# Patient Record
Sex: Male | Born: 1980 | Race: Black or African American | Hispanic: No | Marital: Single | Smoking: Never smoker
Health system: Southern US, Community
[De-identification: ages and names within clinical notes are randomized; demographics above are authoritative.]

## PROBLEM LIST (undated history)

## (undated) DIAGNOSIS — B029 Zoster without complications: Secondary | ICD-10-CM

## (undated) HISTORY — PX: HERNIA REPAIR: SHX51

## (undated) HISTORY — PX: ACHILLES TENDON REPAIR: SUR1153

---

## 2001-05-01 ENCOUNTER — Emergency Department (HOSPITAL_COMMUNITY): Admission: EM | Admit: 2001-05-01 | Discharge: 2001-05-01 | Payer: Self-pay | Admitting: Emergency Medicine

## 2003-07-21 ENCOUNTER — Emergency Department (HOSPITAL_COMMUNITY): Admission: EM | Admit: 2003-07-21 | Discharge: 2003-07-21 | Payer: Self-pay | Admitting: Family Medicine

## 2005-08-13 ENCOUNTER — Emergency Department (HOSPITAL_COMMUNITY): Admission: EM | Admit: 2005-08-13 | Discharge: 2005-08-13 | Payer: Self-pay | Admitting: Emergency Medicine

## 2007-07-19 ENCOUNTER — Emergency Department (HOSPITAL_COMMUNITY): Admission: EM | Admit: 2007-07-19 | Discharge: 2007-07-20 | Payer: Self-pay | Admitting: Emergency Medicine

## 2007-07-24 ENCOUNTER — Emergency Department (HOSPITAL_COMMUNITY): Admission: EM | Admit: 2007-07-24 | Discharge: 2007-07-24 | Payer: Self-pay | Admitting: Family Medicine

## 2008-10-20 ENCOUNTER — Emergency Department (HOSPITAL_BASED_OUTPATIENT_CLINIC_OR_DEPARTMENT_OTHER): Admission: EM | Admit: 2008-10-20 | Discharge: 2008-10-21 | Payer: Self-pay | Admitting: Emergency Medicine

## 2008-10-20 DIAGNOSIS — J020 Streptococcal pharyngitis: Secondary | ICD-10-CM

## 2008-11-03 ENCOUNTER — Ambulatory Visit: Payer: Self-pay | Admitting: Internal Medicine

## 2008-12-29 ENCOUNTER — Ambulatory Visit: Payer: Self-pay | Admitting: Internal Medicine

## 2008-12-29 DIAGNOSIS — J029 Acute pharyngitis, unspecified: Secondary | ICD-10-CM

## 2008-12-29 DIAGNOSIS — K1370 Unspecified lesions of oral mucosa: Secondary | ICD-10-CM

## 2009-02-03 ENCOUNTER — Ambulatory Visit: Payer: Self-pay | Admitting: Internal Medicine

## 2009-02-03 DIAGNOSIS — R300 Dysuria: Secondary | ICD-10-CM

## 2009-02-03 LAB — CONVERTED CEMR LAB: GC Probe Amp, Urine: NEGATIVE

## 2009-02-04 ENCOUNTER — Ambulatory Visit: Payer: Self-pay | Admitting: Internal Medicine

## 2009-02-04 ENCOUNTER — Telehealth: Payer: Self-pay | Admitting: Internal Medicine

## 2010-07-04 LAB — RAPID STREP SCREEN (MED CTR MEBANE ONLY): Streptococcus, Group A Screen (Direct): POSITIVE — AB

## 2010-12-21 LAB — POCT I-STAT, CHEM 8
BUN: 10
Chloride: 104
Potassium: 3.8
Sodium: 140

## 2010-12-21 LAB — DIFFERENTIAL
Basophils Absolute: 0
Lymphocytes Relative: 17
Monocytes Absolute: 0.7
Neutro Abs: 5.2

## 2010-12-21 LAB — CBC
Hemoglobin: 14.4
RDW: 13.2

## 2012-12-15 ENCOUNTER — Emergency Department (HOSPITAL_BASED_OUTPATIENT_CLINIC_OR_DEPARTMENT_OTHER): Payer: Managed Care, Other (non HMO)

## 2012-12-15 ENCOUNTER — Encounter (HOSPITAL_BASED_OUTPATIENT_CLINIC_OR_DEPARTMENT_OTHER): Payer: Self-pay | Admitting: Emergency Medicine

## 2012-12-15 ENCOUNTER — Emergency Department (HOSPITAL_BASED_OUTPATIENT_CLINIC_OR_DEPARTMENT_OTHER)
Admission: EM | Admit: 2012-12-15 | Discharge: 2012-12-16 | Disposition: A | Discharge status: Home / Self Care | Payer: Managed Care, Other (non HMO) | Attending: Emergency Medicine | Admitting: Emergency Medicine

## 2012-12-15 DIAGNOSIS — M62838 Other muscle spasm: Secondary | ICD-10-CM | POA: Insufficient documentation

## 2012-12-15 MED ORDER — IBUPROFEN 800 MG PO TABS
800.0000 mg | ORAL_TABLET | Freq: Once | ORAL | Status: AC
  Administered 2012-12-15: 800 mg via ORAL
  Filled 2012-12-15: qty 1

## 2012-12-15 MED ORDER — METHOCARBAMOL 500 MG PO TABS
1000.0000 mg | ORAL_TABLET | Freq: Once | ORAL | Status: AC
  Administered 2012-12-15: 1000 mg via ORAL
  Filled 2012-12-15: qty 2

## 2012-12-15 NOTE — ED Provider Notes (Signed)
CSN: [id number]     Arrival date & time 12/15/12  2227 History  This chart was scribed for Sharley Keeler Smitty Cords, MD by Ronal Fear, ED Scribe. This patient was seen in room MH05/MH05 and the patient's care was started at 11:29 PM.      Chief Complaint  Patient presents with  . Muscle Pain    left chest after heavy lifting    Patient is a 32 y.o. male presenting with musculoskeletal pain. The history is provided by the patient. No language interpreter was used.  Muscle Pain This is a new problem. The current episode started 6 to 12 hours ago. The problem occurs rarely. The problem has not changed since onset.Pertinent negatives include no chest pain, no abdominal pain, no headaches and no shortness of breath. Exacerbated by: movement in certain directions. Nothing relieves the symptoms. He has tried nothing for the symptoms. The treatment provided no relief.    HPI Comments: Fernando Perez is a 32 y.o. male who presents to the Emergency Department complaining of muscle pain which is possibly due to lifting earlier this afternon.  Pt noticed irregularity while at the movies and he felt the ache in his left pectoral.  Later in the evening he was at a family members house holding his niece and the muscle spasm reoccurred.    History reviewed. No pertinent past medical history. Past Surgical History  Procedure Laterality Date  . Achilles tendon repair Right    History reviewed. No pertinent family history. History  Substance Use Topics  . Smoking status: Never Smoker   . Smokeless tobacco: Not on file  . Alcohol Use: Yes    Review of Systems  Respiratory: Negative for shortness of breath.   Cardiovascular: Negative for chest pain.  Gastrointestinal: Negative for abdominal pain.  Musculoskeletal:       Muscle spasms  Neurological: Negative for headaches.  All other systems reviewed and are negative.    Allergies  Doxycycline hyclate  Home Medications  No current outpatient  prescriptions on file. BP 134/94  Pulse 68  Temp(Src) 98 F (36.7 C) (Oral)  Resp 18  SpO2 100% Physical Exam  Nursing note and vitals reviewed. Constitutional: He is oriented to person, place, and time. He appears well-developed and well-nourished. No distress.  HENT:  Head: Normocephalic and atraumatic.  Mouth/Throat: Oropharynx is clear and moist.  Eyes: EOM are normal. Pupils are equal, round, and reactive to light.  Neck: Normal range of motion. Neck supple. No tracheal deviation present.  Cardiovascular: Normal rate, regular rhythm, normal heart sounds and intact distal pulses.   Pulmonary/Chest: Effort normal. No respiratory distress. He has no wheezes. He has no rales.  Abdominal: Soft. Bowel sounds are normal. There is no tenderness. There is no rebound and no guarding.  Musculoskeletal: Normal range of motion.  Symmetric bulk in pectoralis, no crepitus over sternum, trachea midline. Both nipples out, no retraction muscle spasm in left pectoral. No winging of scapula, symmetric bulk, spin is midline  Lymphadenopathy:    He has no cervical adenopathy.  Neurological: He is alert and oriented to person, place, and time. He has normal reflexes. He displays normal reflexes. He exhibits normal muscle tone.  Skin: Skin is warm and dry.  Psychiatric: He has a normal mood and affect. His behavior is normal.    ED Course  Procedures (including critical care time)  DIAGNOSTIC STUDIES: Oxygen Saturation is 100% on RA, normal by my interpretation.    COORDINATION OF CARE: 11:35 PM-  Pt advised of plan for treatment including chest X-ray, high dose antiinflammatory medication no lifting and a muscle relaxer and applying heat to pectoral.  Pt is to drink only water and pt agrees.      Labs Review Labs Reviewed - No data to display Imaging Review No results found.  MDM  No diagnosis found. Pectoralis spasm  No lifting x 7 days pain medication and muscle relaxant I personally  performed the services described in this documentation, which was scribed in my presence. The recorded information has been reviewed and is accurate.    Jasmine Awe, MD 12/16/12 [phone]

## 2012-12-15 NOTE — ED Notes (Signed)
Pt reports while lifting weight during work out believes he injury pectoris muscle. He notice muscle deformity and pain while lifting child after work out

## 2012-12-16 MED ORDER — METHOCARBAMOL 500 MG PO TABS: 500.0000 mg | ORAL_TABLET | Freq: Two times a day (BID) | ORAL | Status: AC

## 2012-12-16 MED ORDER — IBUPROFEN 800 MG PO TABS: 800.0000 mg | ORAL_TABLET | Freq: Three times a day (TID) | ORAL | Status: AC

## 2013-09-11 ENCOUNTER — Emergency Department (HOSPITAL_BASED_OUTPATIENT_CLINIC_OR_DEPARTMENT_OTHER)
Admission: EM | Admit: 2013-09-11 | Discharge: 2013-09-11 | Disposition: A | Discharge status: Home / Self Care | Payer: Managed Care, Other (non HMO) | Attending: Emergency Medicine | Admitting: Emergency Medicine

## 2013-09-11 ENCOUNTER — Encounter (HOSPITAL_BASED_OUTPATIENT_CLINIC_OR_DEPARTMENT_OTHER): Payer: Self-pay | Admitting: Emergency Medicine

## 2013-09-11 DIAGNOSIS — B029 Zoster without complications: Secondary | ICD-10-CM | POA: Insufficient documentation

## 2013-09-11 DIAGNOSIS — Z791 Long term (current) use of non-steroidal anti-inflammatories (NSAID): Secondary | ICD-10-CM | POA: Insufficient documentation

## 2013-09-11 DIAGNOSIS — Z79899 Other long term (current) drug therapy: Secondary | ICD-10-CM | POA: Insufficient documentation

## 2013-09-11 LAB — HIV ANTIBODY (ROUTINE TESTING W REFLEX): HIV 1&2 Ab, 4th Generation: NONREACTIVE

## 2013-09-11 MED ORDER — HYDROXYZINE HCL 25 MG PO TABS
25.0000 mg | ORAL_TABLET | ORAL | Status: AC | PRN
Start: 2013-09-11 — End: ?

## 2013-09-11 MED ORDER — ACYCLOVIR 800 MG PO TABS: 800.0000 mg | ORAL_TABLET | Freq: Every day | ORAL | Status: AC

## 2013-09-11 MED ORDER — PREDNISONE 20 MG PO TABS: ORAL_TABLET | ORAL | Status: AC

## 2013-09-11 NOTE — Discharge Instructions (Signed)
Shingles °Shingles (herpes zoster) is an infection that is caused by the same virus that causes chickenpox (varicella). The infection causes a painful skin rash and fluid-filled blisters, which eventually break open, crust over, and heal. It may occur in any area of the body, but it usually affects only one side of the body or face. The pain of shingles usually lasts about 1 month. However, some people with shingles may develop long-term (chronic) pain in the affected area of the body. °Shingles often occurs many years after the person had chickenpox. It is more common: °· In people older than 50 years. °· In people with weakened immune systems, such as those with HIV, AIDS, or cancer. °· In people taking medicines that weaken the immune system, such as transplant medicines. °· In people under great stress. °CAUSES  °Shingles is caused by the varicella zoster virus (VZV), which also causes chickenpox. After a person is infected with the virus, it can remain in the person's body for years in an inactive state (dormant). To cause shingles, the virus reactivates and breaks out as an infection in a nerve root. °The virus can be spread from person to person (contagious) through contact with open blisters of the shingles rash. It will only spread to people who have not had chickenpox. When these people are exposed to the virus, they may develop chickenpox. They will not develop shingles. Once the blisters scab over, the person is no longer contagious and cannot spread the virus to others. °SYMPTOMS  °Shingles shows up in stages. The initial symptoms may be pain, itching, and tingling in an area of the skin. This pain is usually described as burning, stabbing, or throbbing. In a few days or weeks, a painful red rash will appear in the area where the pain, itching, and tingling were felt. The rash is usually on one side of the body in a band or belt-like pattern. Then, the rash usually turns into fluid-filled blisters. They  will scab over and dry up in approximately 2-3 weeks. °Flu-like symptoms may also occur with the initial symptoms, the rash, or the blisters. These may include: °· Fever. °· Chills. °· Headache. °· Upset stomach. °DIAGNOSIS  °Your caregiver will perform a skin exam to diagnose shingles. Skin scrapings or fluid samples may also be taken from the blisters. This sample will be examined under a microscope or sent to a lab for further testing. °TREATMENT  °There is no specific cure for shingles. Your caregiver will likely prescribe medicines to help you manage the pain, recover faster, and avoid long-term problems. This may include antiviral drugs, anti-inflammatory drugs, and pain medicines. °HOME CARE INSTRUCTIONS  °· Take a cool bath or apply cool compresses to the area of the rash or blisters as directed. This may help with the pain and itching.   °· Only take over-the-counter or prescription medicines as directed by your caregiver.   °· Rest as directed by your caregiver. °· Keep your rash and blisters clean with mild soap and cool water or as directed by your caregiver.  °· Do not pick your blisters or scratch your rash. Apply an anti-itch cream or numbing creams to the affected area as directed by your caregiver. °· Keep your shingles rash covered with a loose bandage (dressing). °· Avoid skin contact with: °¨ Babies.   °¨ Pregnant women.   °¨ Children with eczema.   °¨ Elderly people with transplants.   °¨ People with chronic illnesses, such as leukemia or AIDS.   °· Wear loose-fitting clothing to help ease the   pain of material rubbing against the rash. °· Keep all follow-up appointments with your caregiver. If the area involved is on your face, you may receive a referral for follow-up to a specialist, such as an eye doctor (ophthalmologist) or an ear, nose, and throat (ENT) doctor. Keeping all follow-up appointments will help you avoid eye complications, chronic pain, or disability.   °SEEK IMMEDIATE MEDICAL  CARE IF:  °· You have facial pain, pain around the eye area, or loss of feeling on one side of your face. °· You have ear pain or ringing in your ear. °· You have loss of taste. °· Your pain is not relieved with prescribed medicines.   °· Your redness or swelling spreads.   °· You have more pain and swelling.  °· Your condition is worsening or has changed.   °· You have a fever or persistent symptoms for more than 2-3 days. °· You have a fever and your symptoms suddenly get worse. °MAKE SURE YOU: °· Understand these instructions. °· Will watch your condition. °· Will get help right away if you are not doing well or get worse. °Document Released: 03/14/2005 Document Revised: 12/07/2011 Document Reviewed: 10/27/2011 °[employer]® Patient Information ©2015 [employer]. This information is not intended to replace advice given to you by your health care provider. Make sure you discuss any questions you have with your health care provider. ° °

## 2013-09-11 NOTE — ED Provider Notes (Addendum)
CSN: [id number]     Arrival date & time 09/11/13  1334 History   First MD Initiated Contact with Patient 09/11/13 1339     Chief Complaint  Patient presents with  . Rash     (Consider location/radiation/quality/duration/timing/severity/associated sxs/prior Treatment) HPI Comments: Patient presents to the ER for evaluation of a rash on his chest and back. Symptoms first came out 2 days ago. He noticed some itching on his back but did not think much of it. The next day he noticed itching on his chest when he looked in the mirror there was a rash there. This rash has spread across the entire length of the right side. He has noticed a small bump on his right shoulder as well now. It is very itchy but nontender.  Patient is a 33 y.o. male presenting with rash.  Rash   History reviewed. No pertinent past medical history. Past Surgical History  Procedure Laterality Date  . Achilles tendon repair Right    No family history on file. History  Substance Use Topics  . Smoking status: Never Smoker   . Smokeless tobacco: Not on file  . Alcohol Use: Yes    Review of Systems  Skin: Positive for rash.  All other systems reviewed and are negative.     Allergies  Doxycycline hyclate  Home Medications   Prior to Admission medications   Medication Sig Start Date End Date Taking? Authorizing Provider  ibuprofen (ADVIL,MOTRIN) 800 MG tablet Take 1 tablet (800 mg total) by mouth 3 (three) times daily. 12/16/12   April K Palumbo-Rasch, MD  methocarbamol (ROBAXIN) 500 MG tablet Take 1 tablet (500 mg total) by mouth 2 (two) times daily. 12/16/12   April K Palumbo-Rasch, MD   BP 159/97  Pulse 67  Temp(Src) 98.4 F (36.9 C) (Oral)  Resp 18  Ht 5\' 9"  (1.753 m)  Wt 148 lb (67.132 kg)  BMI 21.85 kg/m2  SpO2 100% Physical Exam  Constitutional: He is oriented to person, place, and time. He appears well-developed and well-nourished. No distress.  HENT:  Head: Normocephalic and atraumatic.    Right Ear: Hearing normal.  Left Ear: Hearing normal.  Nose: Nose normal.  Mouth/Throat: Oropharynx is clear and moist and mucous membranes are normal.  Eyes: Conjunctivae and EOM are normal. Pupils are equal, round, and reactive to light.  Neck: Normal range of motion. Neck supple.  Cardiovascular: Regular rhythm, S1 normal and S2 normal.  Exam reveals no gallop and no friction rub.   No murmur heard. Pulmonary/Chest: Effort normal and breath sounds normal. No respiratory distress. He exhibits no tenderness.  Abdominal: Soft. Normal appearance and bowel sounds are normal. There is no hepatosplenomegaly. There is no tenderness. There is no rebound, no guarding, no tenderness at McBurney's point and negative Murphy's sign. No hernia.  Musculoskeletal: Normal range of motion.  Neurological: He is alert and oriented to person, place, and time. He has normal strength. No cranial nerve deficit or sensory deficit. Coordination normal. GCS eye subscore is 4. GCS verbal subscore is 5. GCS motor subscore is 6.  Skin: Skin is warm, dry and intact. Rash (Vesicular, clustered rash linearly oriented from thoracic area around her right flank to the sternum region following a dermatomal distribution) noted. No cyanosis.     Psychiatric: He has a normal mood and affect. His speech is normal and behavior is normal. Thought content normal.    ED Course  Procedures (including critical care time) Labs Review Labs Reviewed - No  data to display  Imaging Review No results found.   EKG Interpretation None      MDM   Final diagnoses:  None   shingles versus cutaneous herpes versus contact dermatitis  Patient presents with itchy rash on his right back, flank and chest area. Morphology of the rash is classic for shingles, although the patient is putting out pruritic symptoms, no pain. Additionally, he has 2 small vesicles on his deltoid region which would do not follow strict dermatomal distribution.  Differential diagnosis would be contact dermatitis, herpes skin lesion as well as shingles. Nephrology was not suggest bacterial infection such as cellulitis or abscess. Patient will be treated with acyclovir. In addition, we will add at his own and hydroxyzine her symptomatically. As I do still think this is shingles, we'll also prescribe Vicodin in the event that he started having pain related to the rash.  I did discuss possible implications of shingles in his age group. Specifically I discussed the possibility of being immunocompromised for his concern for HIV. He does report being tested approximately a year ago and was negative, but would like to be tested again. Blood was drawn prior to discharge.  Gilda Creasehristopher J. Pollina, MD 09/11/13 1355  Gilda Creasehristopher J. Pollina, MD 09/11/13 1409

## 2013-09-11 NOTE — ED Notes (Signed)
Pt has a rash on his back, chest, and right arm since Monday.

## 2013-09-21 ENCOUNTER — Emergency Department (HOSPITAL_BASED_OUTPATIENT_CLINIC_OR_DEPARTMENT_OTHER)
Admission: EM | Admit: 2013-09-21 | Discharge: 2013-09-22 | Disposition: A | Discharge status: Home / Self Care | Payer: Managed Care, Other (non HMO) | Attending: Emergency Medicine | Admitting: Emergency Medicine

## 2013-09-21 ENCOUNTER — Encounter (HOSPITAL_BASED_OUTPATIENT_CLINIC_OR_DEPARTMENT_OTHER): Payer: Self-pay | Admitting: Emergency Medicine

## 2013-09-21 DIAGNOSIS — G4489 Other headache syndrome: Secondary | ICD-10-CM | POA: Insufficient documentation

## 2013-09-21 DIAGNOSIS — Z79899 Other long term (current) drug therapy: Secondary | ICD-10-CM | POA: Insufficient documentation

## 2013-09-21 DIAGNOSIS — J3489 Other specified disorders of nose and nasal sinuses: Secondary | ICD-10-CM | POA: Insufficient documentation

## 2013-09-21 DIAGNOSIS — IMO0002 Reserved for concepts with insufficient information to code with codable children: Secondary | ICD-10-CM | POA: Insufficient documentation

## 2013-09-21 DIAGNOSIS — Z8619 Personal history of other infectious and parasitic diseases: Secondary | ICD-10-CM | POA: Insufficient documentation

## 2013-09-21 DIAGNOSIS — J309 Allergic rhinitis, unspecified: Secondary | ICD-10-CM | POA: Insufficient documentation

## 2013-09-21 HISTORY — DX: Zoster without complications: B02.9

## 2013-09-21 MED ORDER — IBUPROFEN 800 MG PO TABS
800.0000 mg | ORAL_TABLET | Freq: Once | ORAL | Status: AC
  Administered 2013-09-21: 800 mg via ORAL
  Filled 2013-09-21: qty 1

## 2013-09-21 MED ORDER — ACETAMINOPHEN 500 MG PO TABS
1000.0000 mg | ORAL_TABLET | Freq: Once | ORAL | Status: AC
  Administered 2013-09-21: 1000 mg via ORAL
  Filled 2013-09-21: qty 2

## 2013-09-21 NOTE — ED Notes (Signed)
Pt c/o headache x 2 days, describes as pressure; starts posteriorly and radiates to frontal. Pt sts he is taking prednisone and acyclovir for recent dx of shingles (rash has cleared).

## 2013-09-21 NOTE — ED Provider Notes (Signed)
CSN: [id number]     Arrival date & time 09/21/13  2027 History   First MD Initiated Contact with Patient 09/21/13 2300    This chart was scribed for April Smitty CordsK Palumbo-Rasch, MD by Marica OtterNusrat Rahman, ED Scribe. This patient was seen in room MH05/MH05 and the patient's care was started at 11:11 PM.  Chief Complaint  Patient presents with  . Headache   Patient is a 33 y.o. male presenting with headaches. The history is provided by the patient. No language interpreter was used.  Headache Pain location:  Occipital Quality:  Dull Radiates to:  Does not radiate Severity currently:  6/10 Severity at highest:  6/10 Onset quality:  Gradual Timing:  Constant Progression:  Unchanged Chronicity:  New Context: not activity   Relieved by:  Nothing Worsened by:  Nothing tried Ineffective treatments:  None tried Associated symptoms: congestion   Associated symptoms: no cough, no dizziness, no fever, no neck pain, no neck stiffness, no numbness, no photophobia, no sore throat and no visual change    HPI Comments: Fernando Perez is a 33 y.o. male who presents to the Emergency Department complaining of intermittent HA onset yesterday. Pt denies sore throat, itchy/watery eyes, photophobia. Pt reports he is presently taking prednisone and acyclovir for recent Dx of shingles and is near end of his taper. Pt denies taking any OTC meds at home to alleviate his pain.   Past Medical History  Diagnosis Date  . Shingles    Past Surgical History  Procedure Laterality Date  . Achilles tendon repair Right    No family history on file. History  Substance Use Topics  . Smoking status: Never Smoker   . Smokeless tobacco: Not on file  . Alcohol Use: Yes    Review of Systems  Constitutional: Negative for fever and chills.  HENT: Positive for congestion. Negative for sore throat.   Eyes: Negative for photophobia, discharge, itching and visual disturbance.  Respiratory: Negative for cough.   Musculoskeletal:  Negative for neck pain and neck stiffness.  Neurological: Positive for headaches. Negative for dizziness, weakness and numbness.  All other systems reviewed and are negative.     Allergies  Doxycycline hyclate  Home Medications   Prior to Admission medications   Medication Sig Start Date End Date Taking? Authorizing Provider  acyclovir (ZOVIRAX) 800 MG tablet Take 1 tablet (800 mg total) by mouth 5 (five) times daily. 09/11/13   Gilda Creasehristopher J. Pollina, MD  hydrOXYzine (ATARAX/VISTARIL) 25 MG tablet Take 1 tablet (25 mg total) by mouth every 4 (four) hours as needed. 09/11/13   Gilda Creasehristopher J. Pollina, MD  ibuprofen (ADVIL,MOTRIN) 800 MG tablet Take 1 tablet (800 mg total) by mouth 3 (three) times daily. 12/16/12   April K Palumbo-Rasch, MD  methocarbamol (ROBAXIN) 500 MG tablet Take 1 tablet (500 mg total) by mouth 2 (two) times daily. 12/16/12   April K Palumbo-Rasch, MD  predniSONE (DELTASONE) 20 MG tablet 3 tabs po daily x 3 days, then 2 tabs x 3 days, then 1.5 tabs x 3 days, then 1 tab x 3 days, then 0.5 tabs x 3 days 09/11/13   Gilda Creasehristopher J. Pollina, MD   Triage Vitals: BP 145/99  Pulse 66  Temp(Src) 97.7 F (36.5 C) (Oral)  Resp 16  Ht 5\' 9"  (1.753 m)  Wt 150 lb (68.04 kg)  BMI 22.14 kg/m2  SpO2 100% Physical Exam  Constitutional: He is oriented to person, place, and time. He appears well-developed and well-nourished. No distress.  HENT:  Head: Normocephalic and atraumatic.  Right Ear: External ear normal.  Left Ear: External ear normal.  Mouth/Throat: Oropharynx is clear and moist. No oropharyngeal exudate.  TM retracted but clear on left side.   cobblestone-like appearance of the mucous membrane consistent with postnasal drip.   Eyes: EOM are normal. Pupils are equal, round, and reactive to light.  Neck: Normal range of motion. Neck supple. No tracheal deviation present. No thyromegaly present.  No meningismus  Cardiovascular: Normal rate, regular rhythm and intact distal  pulses.   No murmur heard. Pulmonary/Chest: Effort normal and breath sounds normal. No respiratory distress. He has no wheezes. He has no rales. He exhibits no tenderness.  Abdominal: Soft. Bowel sounds are normal. Mass: .scribeed. There is no tenderness. There is no rebound and no guarding.  Musculoskeletal: Normal range of motion. He exhibits no edema and no tenderness.  Lymphadenopathy:    He has no cervical adenopathy.  Neurological: He is alert and oriented to person, place, and time. He has normal reflexes. He displays normal reflexes. No cranial nerve deficit. Coordination normal.  Skin: Skin is warm and dry. No rash noted.  Few scab lesions at the lower rib region.   Psychiatric: He has a normal mood and affect. His behavior is normal.    ED Course  Procedures (including critical care time) DIAGNOSTIC STUDIES: Oxygen Saturation is 100% on RA, nl by my interpretation.    COORDINATION OF CARE: 11:15 PM-Discussed treatment plan which includes meds with pt at bedside and pt agreed to plan.   Labs Review Labs Reviewed - No data to display  Imaging Review No results found.   EKG Interpretation None      MDM   Final diagnoses:  None    Suspect headache is from withdrawal of steroids.  Feels markedly improved post tylenol and ibuprofen.     I personally performed the services described in this documentation, which was scribed in my presence. The recorded information has been reviewed and is accurate.     Jasmine AweApril K Palumbo-Rasch, MD 09/22/13 [phone]

## 2013-09-22 ENCOUNTER — Encounter (HOSPITAL_BASED_OUTPATIENT_CLINIC_OR_DEPARTMENT_OTHER): Payer: Self-pay | Admitting: Emergency Medicine

## 2013-09-22 MED ORDER — FLUTICASONE PROPIONATE 50 MCG/ACT NA SUSP: 2.0000 | Freq: Every day | NASAL | Status: AC

## 2013-09-22 MED ORDER — NAPROXEN 500 MG PO TABS: 500.0000 mg | ORAL_TABLET | Freq: Two times a day (BID) | ORAL | Status: AC

## 2013-09-22 NOTE — ED Notes (Signed)
HA improved, denies sx other than HA, [facility] pt updated with plan. "feels better".

## 2014-06-21 IMAGING — CR DG CHEST 2V
2 series · 2 of 2 positions shown · non-contrast
Comparison: None.

CLINICAL DATA: Left-sided pain after lifting.

EXAM:
CHEST  2 VIEW

[w chest pa]
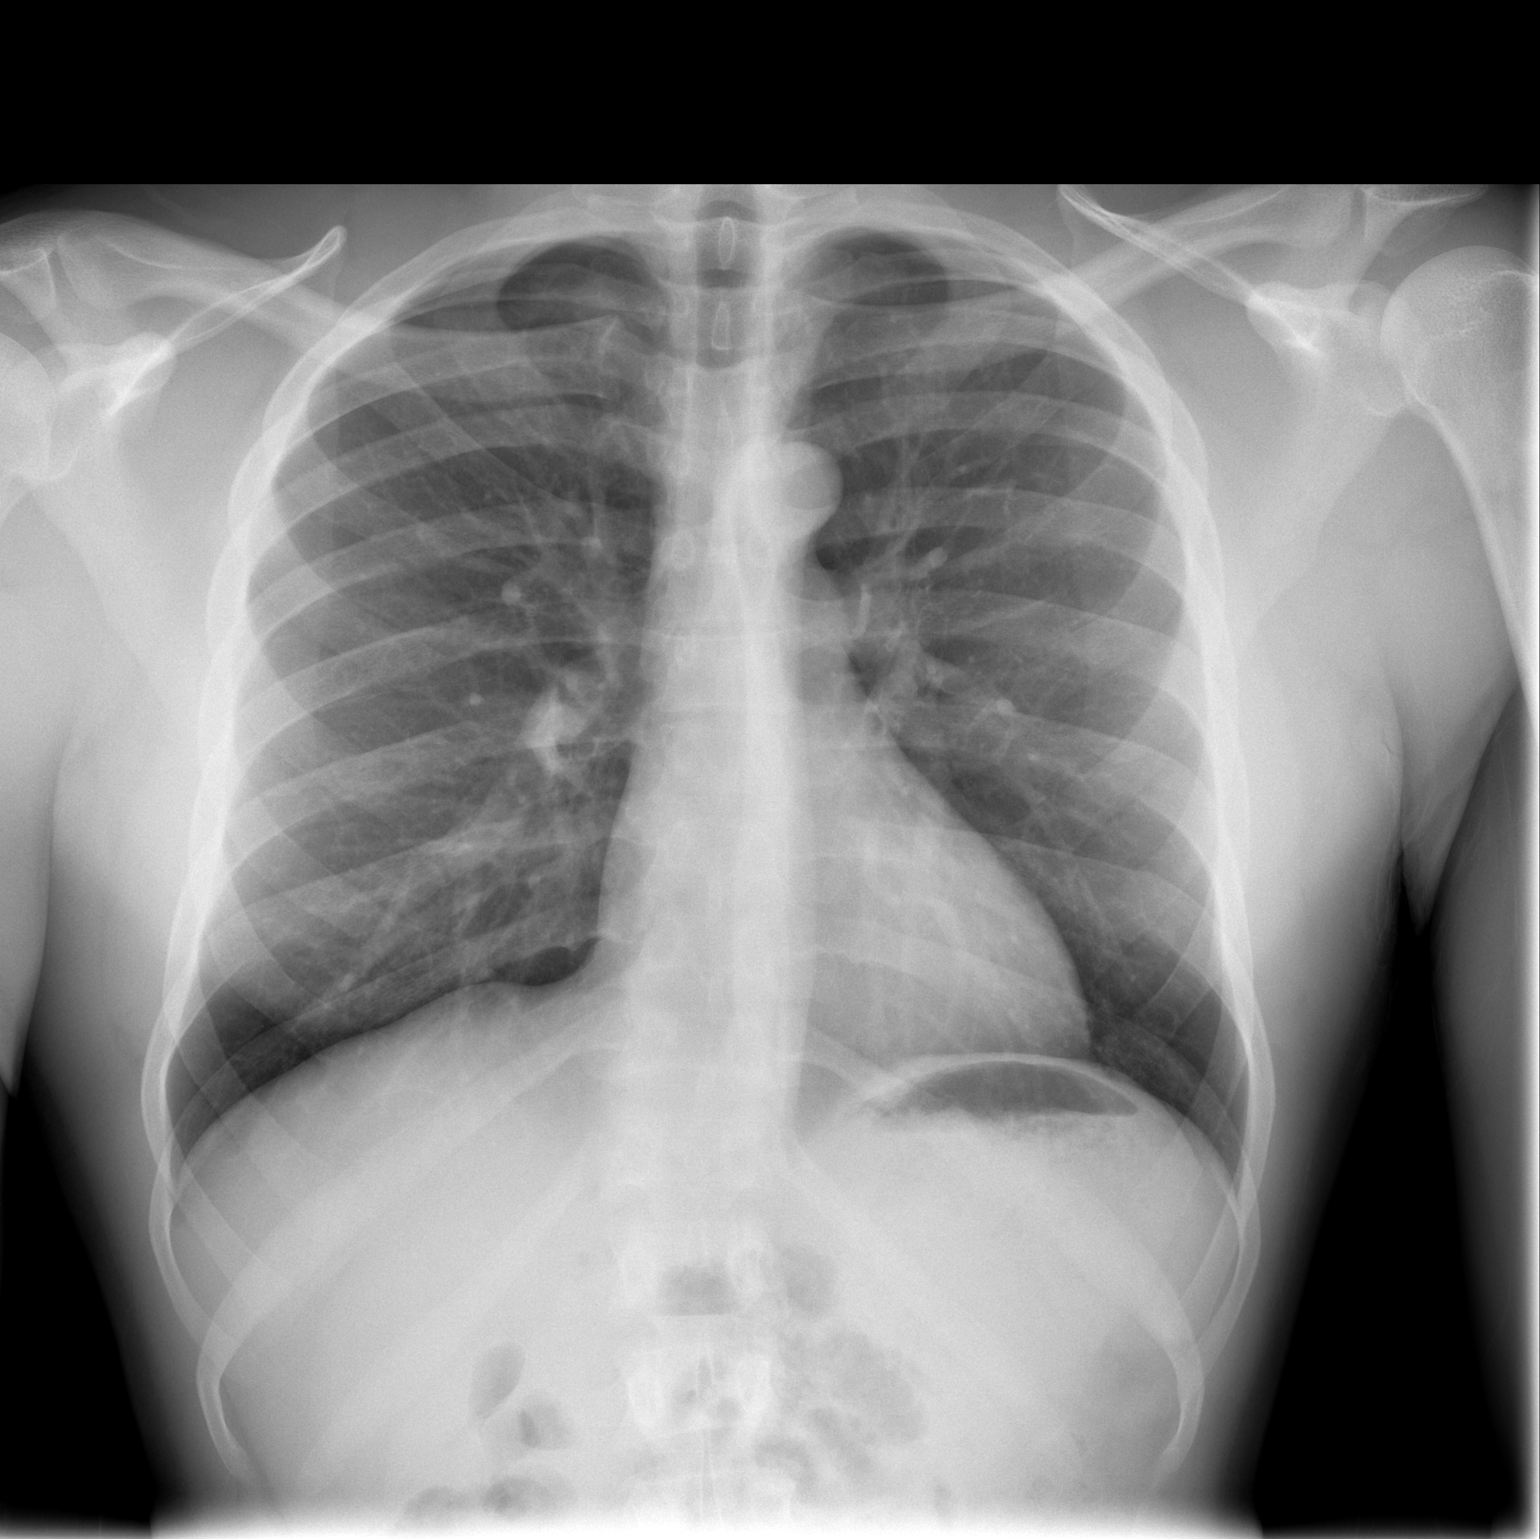

[w chest lat]
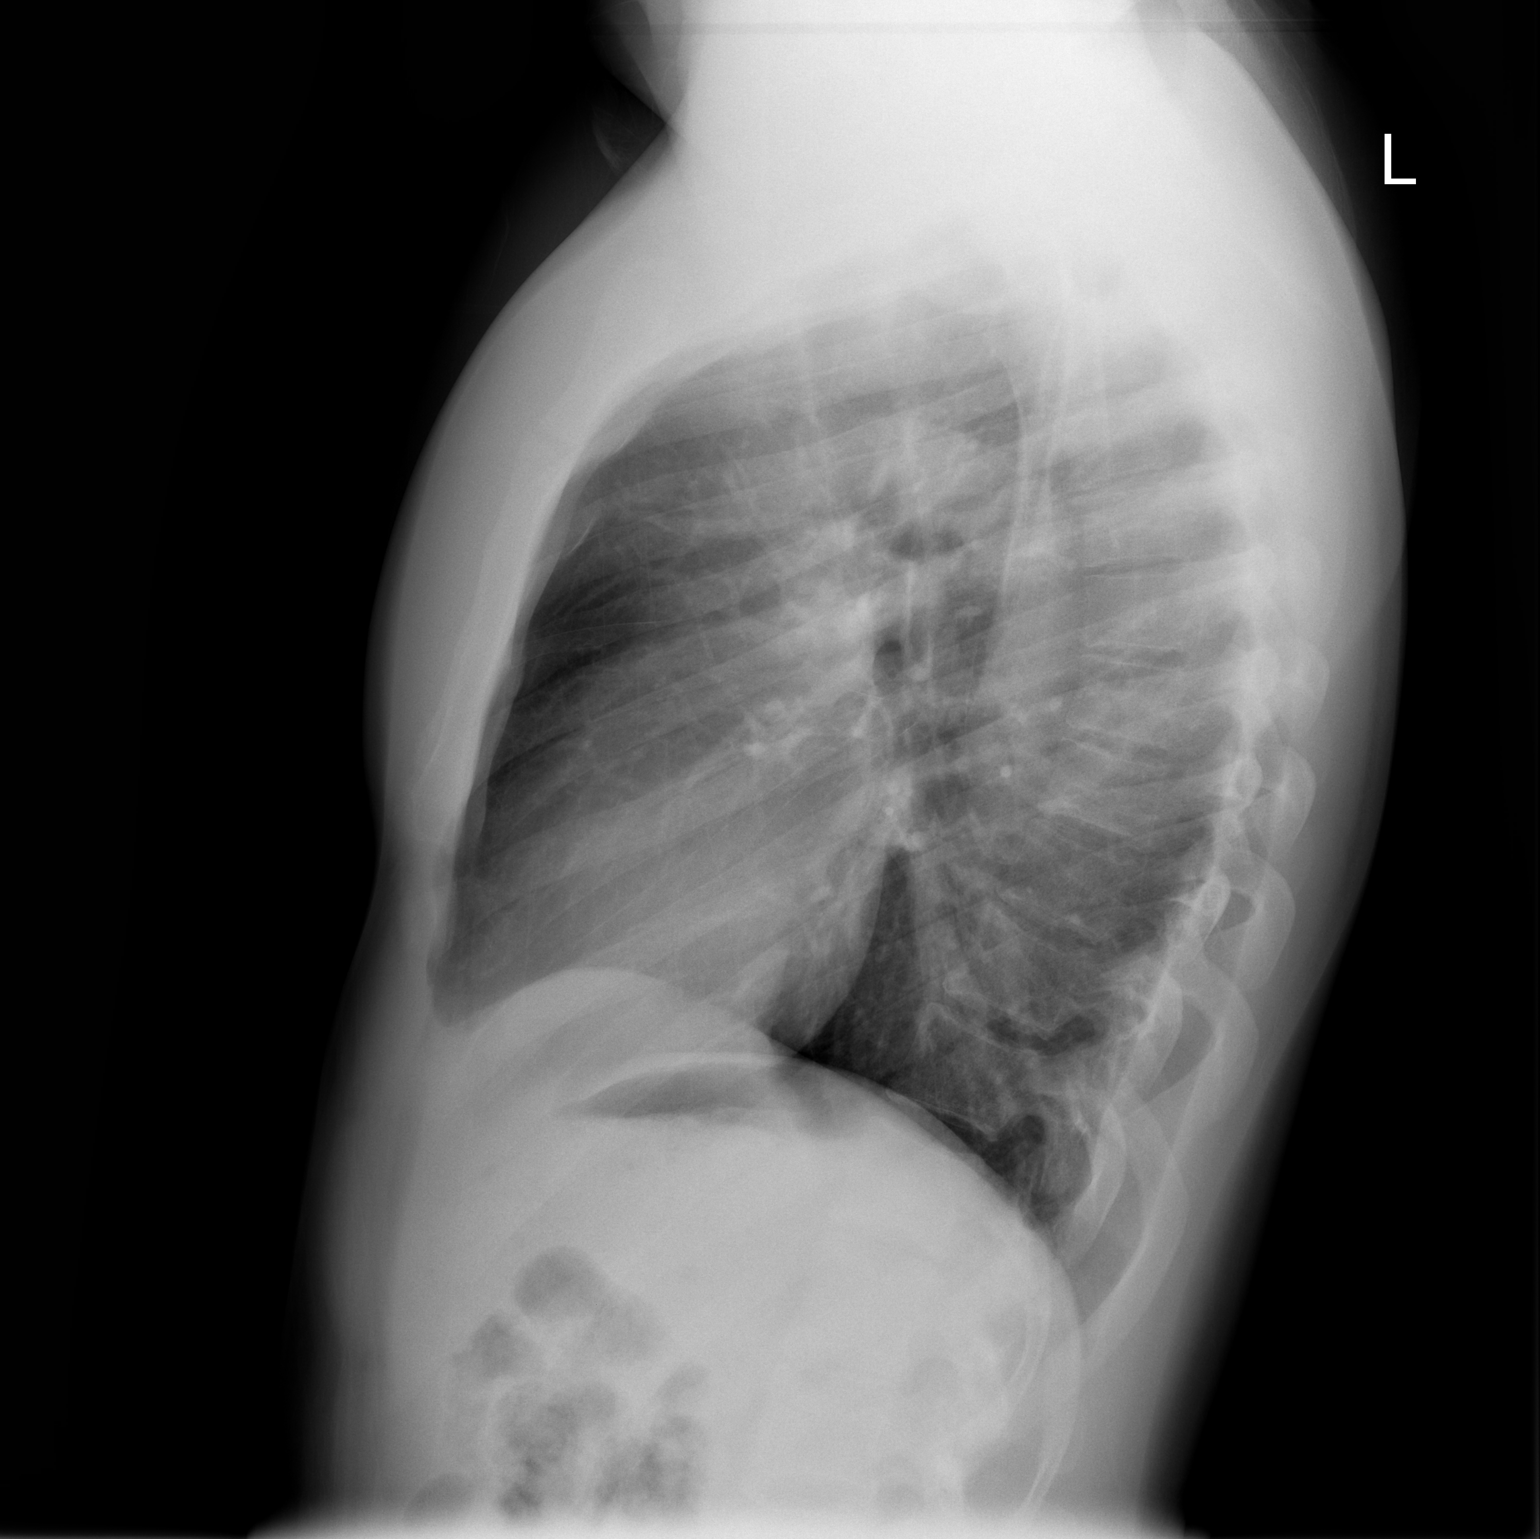

[2 of 2 positions shown; findings below may reference images not displayed]

FINDINGS: The heart size and mediastinal contours are within normal limits.
Both lungs are clear. The visualized skeletal structures are
unremarkable. No pneumothorax.
IMPRESSION: No active cardiopulmonary disease.

## 2017-04-22 ENCOUNTER — Emergency Department (HOSPITAL_BASED_OUTPATIENT_CLINIC_OR_DEPARTMENT_OTHER)
Admission: EM | Admit: 2017-04-22 | Discharge: 2017-04-22 | Disposition: A | Discharge status: Home / Self Care | Payer: BLUE CROSS/BLUE SHIELD | Attending: Emergency Medicine | Admitting: Emergency Medicine

## 2017-04-22 ENCOUNTER — Other Ambulatory Visit: Payer: Self-pay

## 2017-04-22 ENCOUNTER — Emergency Department (HOSPITAL_BASED_OUTPATIENT_CLINIC_OR_DEPARTMENT_OTHER): Payer: BLUE CROSS/BLUE SHIELD

## 2017-04-22 ENCOUNTER — Encounter (HOSPITAL_BASED_OUTPATIENT_CLINIC_OR_DEPARTMENT_OTHER): Payer: Self-pay | Admitting: Emergency Medicine

## 2017-04-22 DIAGNOSIS — B9789 Other viral agents as the cause of diseases classified elsewhere: Secondary | ICD-10-CM | POA: Insufficient documentation

## 2017-04-22 DIAGNOSIS — J069 Acute upper respiratory infection, unspecified: Secondary | ICD-10-CM | POA: Diagnosis not present

## 2017-04-22 DIAGNOSIS — Z79899 Other long term (current) drug therapy: Secondary | ICD-10-CM | POA: Diagnosis not present

## 2017-04-22 DIAGNOSIS — R05 Cough: Secondary | ICD-10-CM | POA: Diagnosis present

## 2017-04-22 NOTE — Discharge Instructions (Signed)
Chest x-ray showed no evidence of pneumonia, your symptoms are likely due to a viral upper respiratory infection.  Please continue to treat your symptoms supportively, ibuprofen and Tylenol as needed for fevers or muscle aches, Flonase and Zyrtec to help with nasal congestion.  If your symptoms are not improving mid next week please follow-up with your primary care doctor.  If you are having persistent fevers, chest pain, shortness of breath or other new or concerning symptoms please return to the ED.

## 2017-04-22 NOTE — ED Notes (Signed)
Patient denies any pain.  He did verbalized generalized soreness.  [place] and he got scared because one of his friends has pneumonia.

## 2017-04-22 NOTE — ED Triage Notes (Addendum)
Cough, sinus headache since Sunday.

## 2017-04-22 NOTE — ED Provider Notes (Signed)
[facility] EMERGENCY DEPARTMENT Provider Note   CSN: [id number] Arrival date & time: 04/22/17  1129     History   Chief Complaint Chief Complaint  Patient presents with  . Cough  . Headache    HPI  Fernando Perez is a 37 y.o. Male who is otherwise healthy, presents to the emergency department complaining of 5 days of cough, rhinorrhea, nasal congestion, sore throat and body aches.  Patient denies any associated fevers or chills.  No chest pain or shortness of breath.  Patient has had some associated frontal sinus pressure and headaches, no associated vision changes, dizziness, neck pain or stiffness.  He denies any abdominal pain, nausea or vomiting.  He has been treating his symptoms supportively with over-the-counter medications such as NyQuil and Sudafed with some improvement.  Patient reports he was [place] the [place] over the weekend and found out 1 of his friends who is having similar symptoms have been diagnosed with pneumonia and wanted to be checked out.      Past Medical History:  Diagnosis Date  . Shingles     Patient Active Problem List   Diagnosis Date Noted  . DYSURIA 02/03/2009  . PHARYNGITIS, ACUTE 12/29/2008  . OTHER&UNSPECIFIED DISEASES THE ORAL SOFT TISSUES 12/29/2008  . STREPTOCOCCAL PHARYNGITIS 10/20/2008    Past Surgical History:  Procedure Laterality Date  . ACHILLES TENDON REPAIR Right   . HERNIA REPAIR         Home Medications    Prior to Admission medications   Medication Sig Start Date End Date Taking? Authorizing Provider  acyclovir (ZOVIRAX) 800 MG tablet Take 1 tablet (800 mg total) by mouth 5 (five) times daily. 09/11/13   Gilda CreasePollina, Christopher J, MD  fluticasone (FLONASE) 50 MCG/ACT nasal spray Place 2 sprays into both nostrils daily. 09/22/13   Palumbo, April, MD  hydrOXYzine (ATARAX/VISTARIL) 25 MG tablet Take 1 tablet (25 mg total) by mouth every 4 (four) hours as needed. 09/11/13   Gilda CreasePollina, Christopher J, MD    ibuprofen (ADVIL,MOTRIN) 800 MG tablet Take 1 tablet (800 mg total) by mouth 3 (three) times daily. 12/16/12   Palumbo, April, MD  methocarbamol (ROBAXIN) 500 MG tablet Take 1 tablet (500 mg total) by mouth 2 (two) times daily. 12/16/12   Palumbo, April, MD  naproxen (NAPROSYN) 500 MG tablet Take 1 tablet (500 mg total) by mouth 2 (two) times daily with a meal. 09/22/13   Palumbo, April, MD  predniSONE (DELTASONE) 20 MG tablet 3 tabs po daily x 3 days, then 2 tabs x 3 days, then 1.5 tabs x 3 days, then 1 tab x 3 days, then 0.5 tabs x 3 days 09/11/13   Gilda CreasePollina, Christopher J, MD    Family History No family history on file.  Social History Social History   Tobacco Use  . Smoking status: Never Smoker  . Smokeless tobacco: Never Used  Substance Use Topics  . Alcohol use: Yes  . Drug use: No     Allergies   Doxycycline hyclate   Review of Systems Review of Systems  Constitutional: Negative for chills and fever.  HENT: Positive for congestion, postnasal drip, rhinorrhea, sinus pressure, sinus pain and sore throat. Negative for ear discharge, ear pain and trouble swallowing.   Eyes: Negative for discharge, redness and itching.  Respiratory: Positive for cough. Negative for chest tightness, shortness of breath, wheezing and stridor.   Cardiovascular: Negative for chest pain.  Gastrointestinal: Negative for abdominal pain, nausea and vomiting.  Musculoskeletal:  Positive for myalgias. Negative for arthralgias and joint swelling.  Skin: Negative for color change and rash.  Neurological: Positive for headaches. Negative for dizziness, weakness and light-headedness.     Physical Exam Updated Vital Signs BP 135/84 (BP Location: Left Arm)   Pulse 90   Temp 98.4 F (36.9 C) (Oral)   Resp 16   Ht 5\' 9"  (1.753 m)   Wt 68 kg (150 lb)   SpO2 100%   BMI 22.15 kg/m   Physical Exam  Constitutional: He appears well-developed and well-nourished. No distress.  HENT:  Head: Normocephalic  and atraumatic.  TMs clear with good landmarks, moderate nasal mucosa edema with clear rhinorrhea, posterior oropharynx clear and moist, with some erythema, no edema or exudates, uvula midline, no trismus  Eyes: EOM are normal. Pupils are equal, round, and reactive to light. Right eye exhibits no discharge. Left eye exhibits no discharge.  Neck: Normal range of motion. Neck supple. No neck rigidity.  Cardiovascular: Normal rate, regular rhythm, normal heart sounds and intact distal pulses.  Pulmonary/Chest: Effort normal and breath sounds normal. No stridor. No respiratory distress. He has no wheezes. He has no rales.  Respirations are equal and unlabored, patient speaking in full sentences, lungs clear to auscultation bilaterally with good air movement throughout  Abdominal: Soft. Bowel sounds are normal. He exhibits no distension. There is no tenderness.  Lymphadenopathy:    He has no cervical adenopathy.  Neurological: He is alert. Coordination normal.  Speech is clear, able to follow commands CN III-XII intact Normal strength in upper and lower extremities bilaterally including dorsiflexion and plantar flexion, strong and equal grip strength Sensation normal to light and sharp touch Moves extremities without ataxia, coordination intact  Skin: Skin is warm and dry. Capillary refill takes less than 2 seconds. He is not diaphoretic.  Psychiatric: He has a normal mood and affect. His behavior is normal.  Nursing note and vitals reviewed.    ED Treatments / Results  Labs (all labs ordered are listed, but only abnormal results are displayed) Labs Reviewed - No data to display  EKG  EKG Interpretation None       Radiology Dg Chest 2 View  Result Date: 04/22/2017 CLINICAL DATA:  37 year old male with a history of productive cough and body aches EXAM: CHEST  2 VIEW COMPARISON:  12/16/2012 FINDINGS: The heart size and mediastinal contours are within normal limits. Both lungs are clear.  The visualized skeletal structures are unremarkable. IMPRESSION: Negative for acute cardiopulmonary disease Electronically Signed   By: Gilmer Mor D.O.   On: 04/22/2017 14:33    Procedures Procedures (including critical care time)  Medications Ordered in ED Medications - No data to display   Initial Impression / Assessment and Plan / ED Course  I have reviewed the triage vital signs and the nursing notes.  Pertinent labs & imaging results that were available during my care of the patient were reviewed by me and considered in my medical decision making (see chart for details).  Pt presents with nasal congestion and cough. Pt is well appearing and vitals are normal. Lungs CTA on exam.  No neurologic deficits or concern for meningitis.  Pt CXR negative for acute infiltrate. Patients symptoms are consistent with URI, likely viral etiology. Discussed that antibiotics are not indicated for viral infections. Pt will be discharged with symptomatic treatment.  Patient to follow-up with his PCP next week if symptoms are not improving.  Verbalizes understanding and is agreeable with plan. Pt  is hemodynamically stable & in NAD prior to dc.   Final Clinical Impressions(s) / ED Diagnoses   Final diagnoses:  Viral URI with cough    ED Discharge Orders    None       Legrand Rams 04/22/17 1727    Terrilee Files, MD 04/23/17 1556

## 2017-04-22 NOTE — ED Notes (Signed)
Patient left without signing the discharge paper.  Patient is stable and ambulatory at the discharge.

## 2018-10-26 IMAGING — DX DG CHEST 2V
2 series · 2 of 2 positions shown · non-contrast
Comparison: 12/16/2012

CLINICAL DATA: 36-year-old male with a history of productive cough
and body aches

EXAM:
CHEST  2 VIEW

[chest pa]
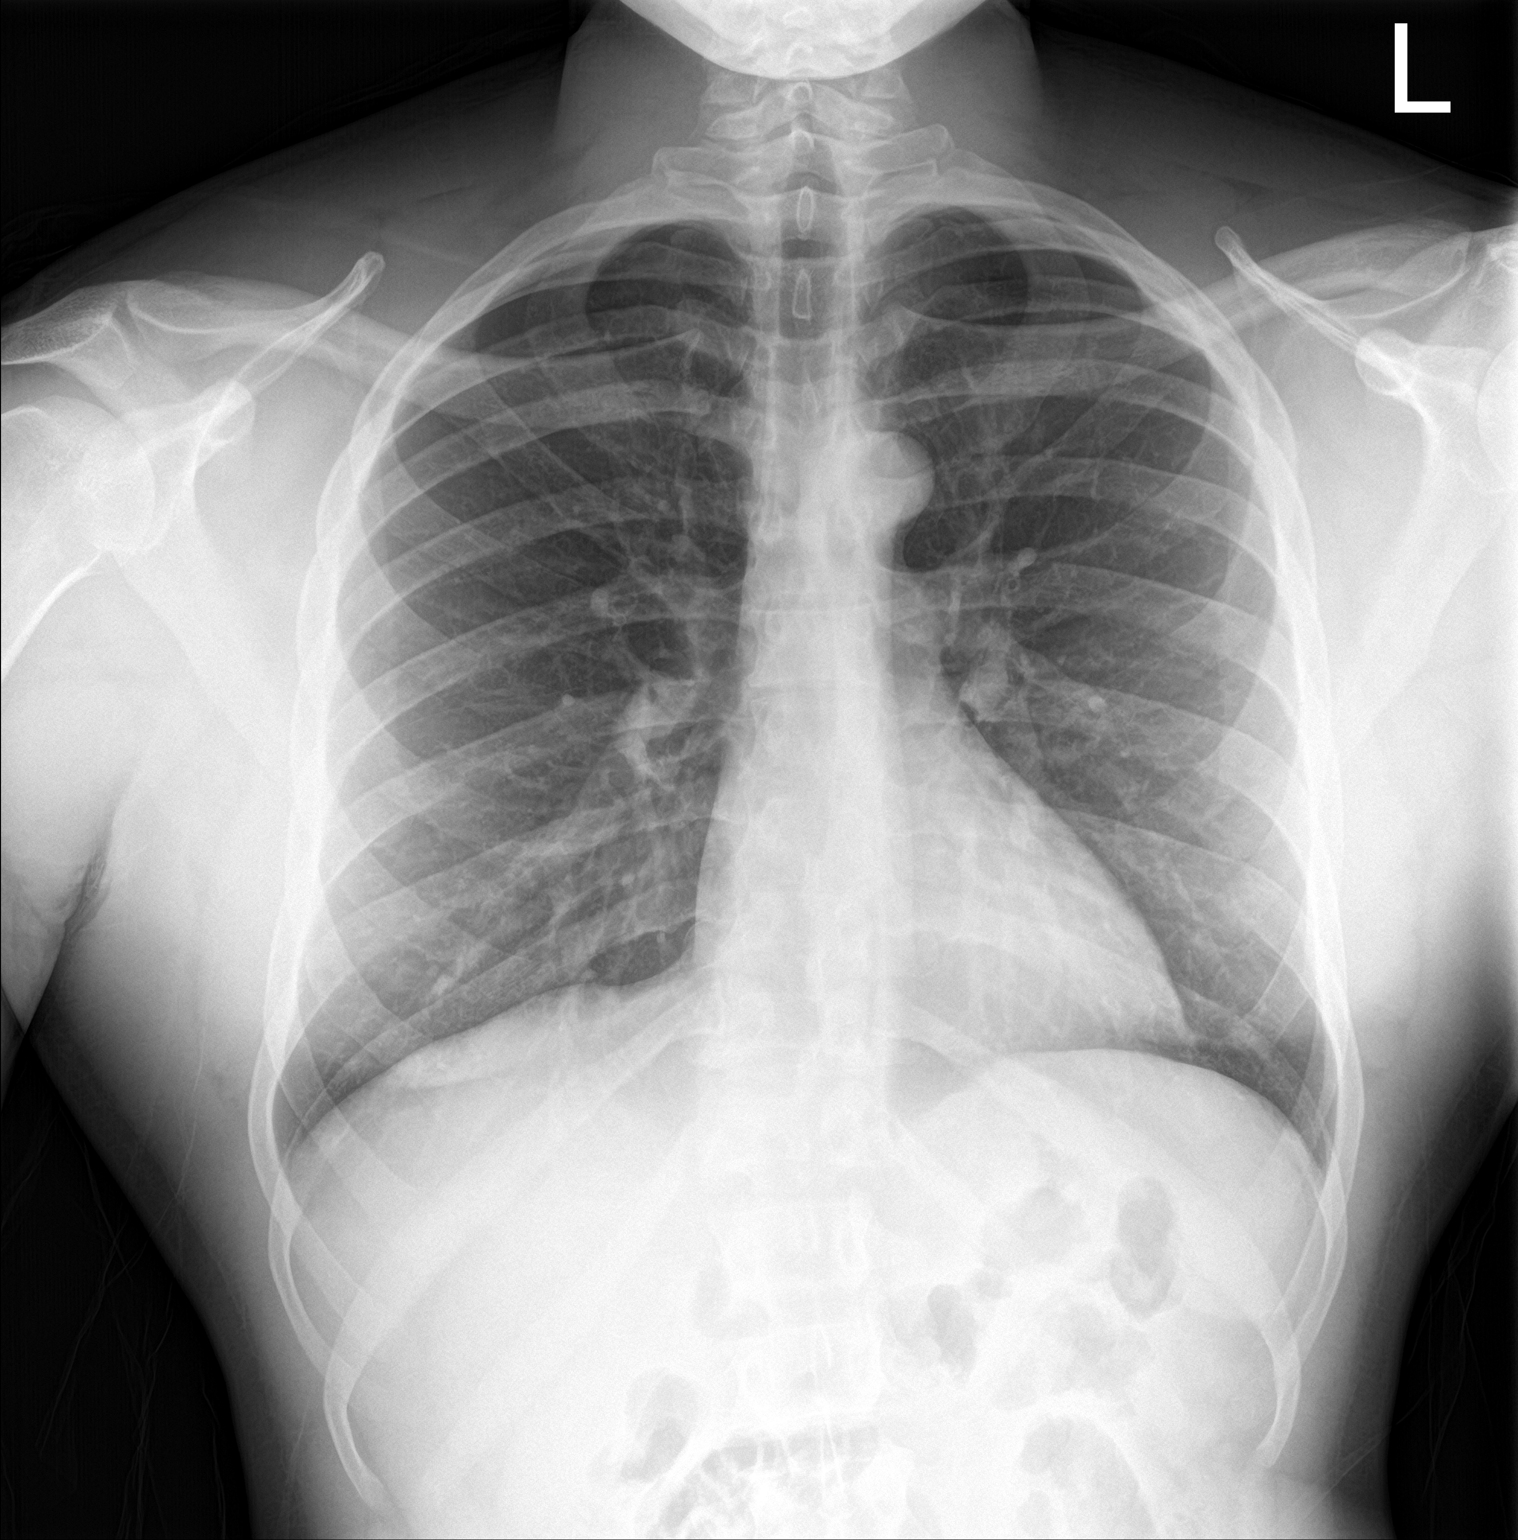

[chest lat]
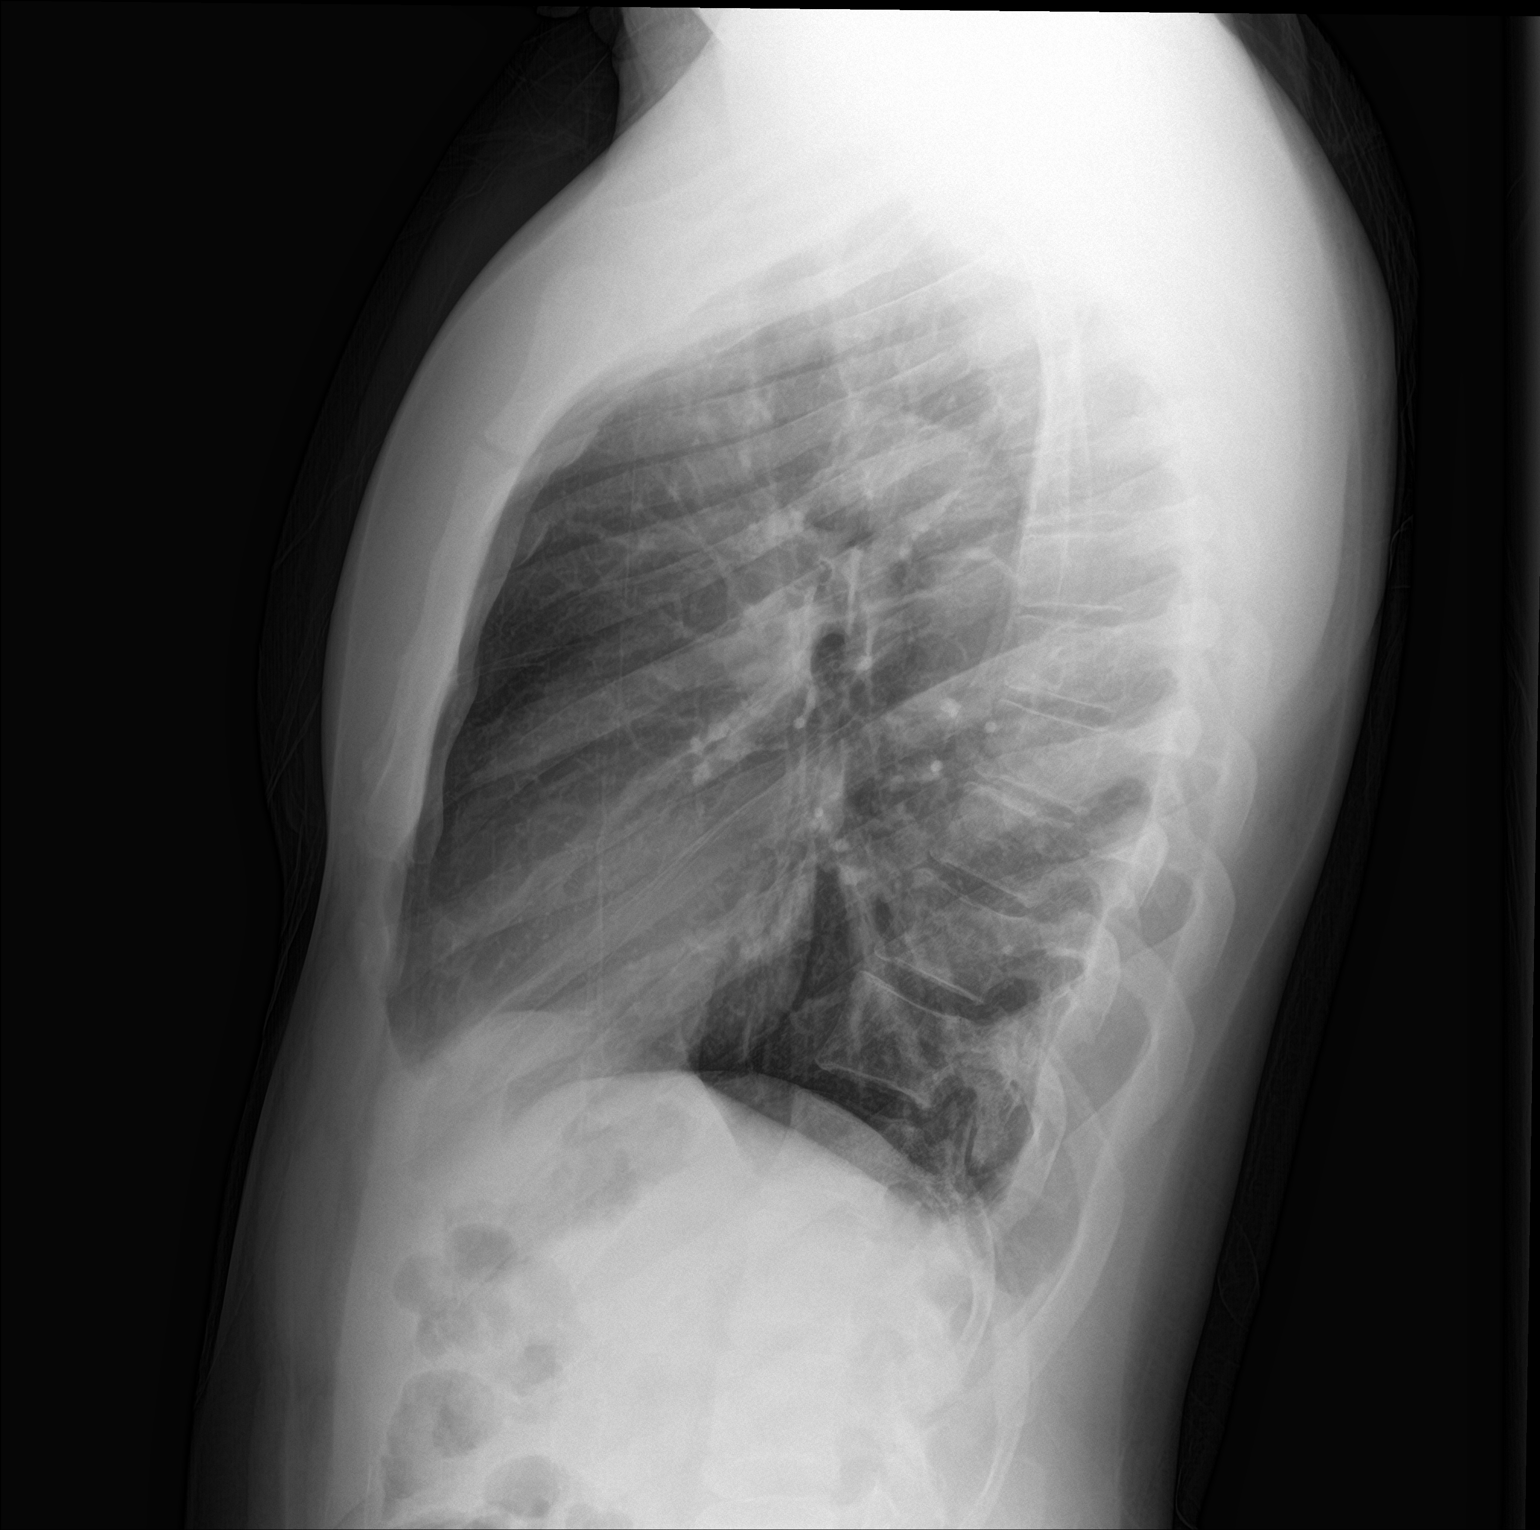

[2 of 2 positions shown; findings below may reference images not displayed]

FINDINGS: The heart size and mediastinal contours are within normal limits.
Both lungs are clear. The visualized skeletal structures are
unremarkable.
IMPRESSION: Negative for acute cardiopulmonary disease
